# Patient Record
Sex: Male | Born: 1970 | Race: White | Hispanic: No | Marital: Single | Smoking: Current every day smoker
Health system: Southern US, Community
[De-identification: ages and names within clinical notes are randomized; demographics above are authoritative.]

---

## 1998-06-07 ENCOUNTER — Emergency Department (HOSPITAL_COMMUNITY): Admission: EM | Admit: 1998-06-07 | Discharge: 1998-06-07 | Payer: Self-pay | Admitting: Emergency Medicine

## 2003-08-06 ENCOUNTER — Emergency Department (HOSPITAL_COMMUNITY): Admission: EM | Admit: 2003-08-06 | Discharge: 2003-08-06 | Payer: Self-pay | Admitting: Emergency Medicine

## 2006-11-14 ENCOUNTER — Emergency Department (HOSPITAL_COMMUNITY): Admission: EM | Admit: 2006-11-14 | Discharge: 2006-11-14 | Payer: Self-pay | Admitting: Emergency Medicine

## 2007-04-15 ENCOUNTER — Emergency Department (HOSPITAL_COMMUNITY): Admission: EM | Admit: 2007-04-15 | Discharge: 2007-04-15 | Payer: Self-pay | Admitting: Family Medicine

## 2007-08-06 ENCOUNTER — Inpatient Hospital Stay (HOSPITAL_COMMUNITY): Admission: EM | Admit: 2007-08-06 | Discharge: 2007-08-10 | Payer: Self-pay | Admitting: Emergency Medicine

## 2007-08-07 ENCOUNTER — Ambulatory Visit: Payer: Self-pay | Admitting: Infectious Diseases
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
## 2010-10-02 NOTE — Consult Note (Signed)
NAME:  Roger Vargas, Roger Vargas NO.:  [id number]   MEDICAL RECORD NO.:  [id number]          PATIENT TYPE:  INP   LOCATION:  1526                         FACILITY:  [facility]   PHYSICIAN:  Sharlet Salina T. Hoxworth, M.D.DATE OF BIRTH:  1971/05/15   DATE OF CONSULTATION:  08/06/2007  DATE OF DISCHARGE:                                 CONSULTATION   CHIEF COMPLAINT:  Pain, swelling, drainage perineum.   HISTORY OF PRESENT ILLNESS:  I was asked by Dr. Brien Few to evaluate Mr.  Roger Vargas.  He is a generally healthy 40 year old white male who  about 2-3 days ago noticed a small uncomfortable black spot on his  perineum between the scrotum and anus.  This was mildly uncomfortable  and he initially thought it was an ingrown hair.  Over the next day or  so however, he developed increasing discomfort, redness and swelling.  He tried to drain the area himself and removed a small amount of tissue  but he developed increasing redness, swelling.  He has had some fever.  He has had some purulent drainage from the area today.  He presented to  the [facility] emergency room for evaluation.  He denies any previous  history of similar illness.  No diabetes or chronic illness.   PAST MEDICAL HISTORY:  He had hip surgery as a child.  No other  significant surgery, medical illness or hospitalization.   MEDICATIONS:  No regular medicines.   ALLERGIES:  None.   SOCIAL HISTORY:  He is single, works in delivery, smokes cigarettes,  drinks occasional alcohol.   FAMILY HISTORY:  Noncontributory.   REVIEW OF SYSTEMS:  GENERAL:  Positive for some fever and malaise.  RESPIRATORY:  No shortness of breath, cough, wheezing.  CARDIAC:  No  chest pain, palpitations or history of heart disease.  ABDOMEN:  No  abdominal pain, nausea, vomiting, diarrhea.   PHYSICAL EXAM:  Temperature is 99, heart rate is 101, respirations 20,  blood pressure 177/77.  GENERAL:  Alert, well-developed white male in no acute  distress.  SKIN:  See GU/perineum. Warm and dry.  HEENT:  No palpable mass or thyromegaly.  Sclerae nonicteric.  LUNGS:  Clear without wheezing or increased work of breathing.  CARDIAC:  Regular tachycardia.  No murmur.  No edema.  ABDOMEN:  Soft and nontender.  GU/PERINEUM:  Overlying the perineal body is a 3-4 mm skin opening with  purulent drainage and some apparent slight necrotic material with  pressure appearance. Some underlying fluctuance.  There is marked  surrounding erythema and edema over the perineum down to the proximal  thigh crease up over the scrotum and into the lower inguinal areas.  NEUROLOGIC:  Alert. Motor and sensory exam is grossly normal.   LABORATORY AND X-RAY DATA:  White count elevated at 23.7, hemoglobin 15.  Electrolytes abnormal for a sodium of 130, BUN and creatinine are  normal. Urinalysis unremarkable. CT scan of the abdomen and pelvis is  reviewed.  This shows significant inflammatory change over the perineum,  scrotum and inguinal areas. No identifiable abscesses drained.  Concern  was  raised over early Fournier's gangrene.   ASSESSMENT/PLAN:  Severe soft tissue infection of the perineum.  On  examination there is poorly drained purulent or necrotic material.  This  will require I&D and possible debridement. The patient is currently on  broad-spectrum antibiotics.  Unfortunately, he was just fed a full meal  prior to my seeing the patient and surgery may need to be delayed for  this reason and will discuss with anesthesia.      Lorne Skeens. Hoxworth, M.D.  Electronically Signed     BTH/MEDQ  D:  08/06/2007  T:  08/07/2007  Job:  [id number]

## 2010-10-02 NOTE — Op Note (Signed)
NAME:  [name], [name] NO.:  [id number]   MEDICAL RECORD NO.:  [id number]          PATIENT TYPE:  INP   LOCATION:  1526                         [facility]AC[facility]   PHYSICIAN:  Sharlet Salina T. Hoxworth, M.D.DATE OF BIRTH:  26-Oct-1970   DATE OF PROCEDURE:  08/06/2007  DATE OF DISCHARGE:                               OPERATIVE REPORT   PRE AND POSTOPERATIVE DIAGNOSIS:  Perineal abscess.   SURGICAL PROCEDURES:  Incision and drainage of perineal abscess.   SURGEON:  Lorne Skeens. Hoxworth, M.D.   ANESTHESIA:  General.   BRIEF HISTORY:  Roger Vargas is a 40 year old male who presents with a  48 to 72-hour history of initial small painful skin blister on his  perineum which has progressed to fever, redness and swelling involving  his perineum, scrotum and up into the inguinal areas.  He has  subsequently developed purulent drainage.  Examination reveals an area  of purulent drainage over the perineal body with some purulent fluid and  necrotic-appearing debris.  White count was 23,000 and CT scan shows  extensive inflammatory change in the soft tissue over the perineum and  scrotum, a question of early Fournier's gangrene.  He appears to have at  least an incompletely drained abscess and I have recommended incision  and drainage, possible debridement in the operating room.  He is already  on broad spectrum antibiotics.   DESCRIPTION OF OPERATION:  The patient was brought to the operating room  and placed in supine position on the operating table and general  endotracheal anesthesia was induced.  He was carefully positioned in  lithotomy position and the perineum sterilely prepped and draped.  Correct patient and procedure were verified.  Again there was a several  millimeter pinpoint opening over the perineal body with purulent  drainage.  I excised a 2-3 cm ellipse of skin around this and dissection  was carried down into a discrete abscess cavity was contained a small  amount of necrotic probably subcutaneous tissue and purulent fluid.  This was cultured.  There was a small amount necrotic tissue at the base  and underlying muscle was exposed but there did not appear to be any  tracking toward any of the areas of inflammation up toward the scrotum  or thighs and only a small amount necrotic tissue locally with the  abscess.  All necrotic tissue was debrided and this left a cavity about  3 x 1.5 cm x 1 cm.  This area was infiltrated with Marcaine.  Saline 4x4  gauze packing was placed and then dry sterile dressing.  The patient was  taken recovery in good condition.      Lorne Skeens. Hoxworth, M.D.  Electronically Signed     BTH/MEDQ  D:  08/06/2007  T:  08/07/2007  Job:  [id number]

## 2011-02-11 LAB — BASIC METABOLIC PANEL
BUN: 10
BUN: 9
CO2: 29
Calcium: 9.4
Chloride: 100
Chloride: 92 — ABNORMAL LOW
Creatinine, Ser: 0.82
GFR calc Af Amer: >60
GFR calc non Af Amer: >60
GFR calc non Af Amer: >60
GFR calc non Af Amer: >60
GFR calc non Af Amer: >60
Glucose, Bld: 125 — ABNORMAL HIGH
Glucose, Bld: 136 — ABNORMAL HIGH
Glucose, Bld: 98
Potassium: 3.6
Potassium: 3.8
Potassium: 3.9
Sodium: 133 — ABNORMAL LOW
Sodium: 142

## 2011-02-11 LAB — CBC
HCT: 37.6 — ABNORMAL LOW
HCT: 38.3 — ABNORMAL LOW
HCT: 42.3
Hemoglobin: 13.2
Hemoglobin: 14.8
MCHC: 34.1
MCHC: 34.4
MCV: 85.9
MCV: 88.3
Platelets: 225
Platelets: 243
Platelets: 262
RBC: 4.31
RBC: 4.91
RDW: 13.8
RDW: 13.9
RDW: 14.1
RDW: 14.2

## 2011-02-11 LAB — CULTURE, BLOOD (ROUTINE X 2)

## 2011-02-11 LAB — VANCOMYCIN, TROUGH: Vancomycin Tr: 6.3

## 2011-02-11 LAB — DIFFERENTIAL
Basophils Absolute: 0
Eosinophils Absolute: 0
Lymphs Abs: 0.9
Monocytes Absolute: 1.7 — ABNORMAL HIGH

## 2011-02-11 LAB — URINALYSIS, ROUTINE W REFLEX MICROSCOPIC
Bilirubin Urine: NEGATIVE
Hgb urine dipstick: NEGATIVE
Protein, ur: NEGATIVE
Urobilinogen, UA: 0.2

## 2011-02-11 LAB — WOUND CULTURE

## 2017-01-12 ENCOUNTER — Emergency Department (HOSPITAL_BASED_OUTPATIENT_CLINIC_OR_DEPARTMENT_OTHER)
Admission: EM | Admit: 2017-01-12 | Discharge: 2017-01-13 | Disposition: A | Discharge status: Home / Self Care | Payer: Self-pay | Attending: Emergency Medicine | Admitting: Emergency Medicine

## 2017-01-12 ENCOUNTER — Encounter (HOSPITAL_BASED_OUTPATIENT_CLINIC_OR_DEPARTMENT_OTHER): Payer: Self-pay | Admitting: Emergency Medicine

## 2017-01-12 DIAGNOSIS — K0889 Other specified disorders of teeth and supporting structures: Secondary | ICD-10-CM | POA: Insufficient documentation

## 2017-01-12 DIAGNOSIS — F17200 Nicotine dependence, unspecified, uncomplicated: Secondary | ICD-10-CM | POA: Insufficient documentation

## 2017-01-12 NOTE — ED Triage Notes (Signed)
Patient states that he is having pain to the left side of his mouth x 3 days. The patient reports that he may have bit down on something wrong. Denies any N/V or trouble swallowing

## 2017-01-13 MED ORDER — NAPROXEN 500 MG PO TABS: ORAL_TABLET | ORAL | 0 refills | Status: AC

## 2017-01-13 MED ORDER — BUPIVACAINE-EPINEPHRINE (PF) 0.5% -1:200000 IJ SOLN
1.8000 mL | Freq: Once | INTRAMUSCULAR | Status: AC
  Administered 2017-01-13: 1.8 mL
  Filled 2017-01-13: qty 1.8

## 2017-01-13 MED ORDER — PENICILLIN V POTASSIUM 250 MG PO TABS
500.0000 mg | ORAL_TABLET | Freq: Once | ORAL | Status: AC
  Administered 2017-01-13: 500 mg via ORAL
  Filled 2017-01-13: qty 2

## 2017-01-13 MED ORDER — PENICILLIN V POTASSIUM 500 MG PO TABS: 500.0000 mg | ORAL_TABLET | Freq: Four times a day (QID) | ORAL | 0 refills | Status: AC

## 2017-01-13 MED ORDER — HYDROCODONE-ACETAMINOPHEN 5-325 MG PO TABS: 1.0000 | ORAL_TABLET | Freq: Four times a day (QID) | ORAL | 0 refills | Status: AC | PRN

## 2017-01-13 NOTE — ED Notes (Signed)
Took Aleve earlier today with little help.

## 2017-01-13 NOTE — ED Provider Notes (Signed)
[facility] Provider Note: [name], MD, FACEP  CSN: [id number] MRN: [id number] ARRIVAL: 01/12/17 at 2034 ROOM: MH09/MH09   CHIEF COMPLAINT  Dental Pain   HISTORY OF PRESENT ILLNESS  01/13/17 1:18 AM Roger Vargas is a 46 y.o. male with a 2 to three-day history of pain associated with his left lower first molar. The pain has become severe and is worse with eating or drinking. There is associated swelling of the adjacent soft tissue. There is no associated fever or chills. There is no associated lymphadenopathy. He has not gotten relief with over-the-counter medications.  Consultation with the [facility] state controlled substances database reveals the patient has received no opioid prescriptions in the past year.   History reviewed. No pertinent past medical history.  History reviewed. No pertinent surgical history.  History reviewed. No pertinent family history.  Social History  Substance Use Topics  . Smoking status: Current Every Day Smoker  . Smokeless tobacco: Never Used  . Alcohol use Yes    Prior to Admission medications   Medication Sig Start Date End Date Taking? Authorizing Provider  HYDROcodone-acetaminophen (NORCO) 5-325 MG tablet Take 1 tablet by mouth every 6 (six) hours as needed (for severe pain). 01/13/17   Carolos Fecher, MD  naproxen (NAPROSYN) 500 MG tablet Take one tablet twice daily as needed for dental pain. 01/13/17   Marriana Hibberd, MD  penicillin v potassium (VEETID) 500 MG tablet Take 1 tablet (500 mg total) by mouth 4 (four) times daily. 01/13/17   Abed Schar, Jonny Ruiz, MD    Allergies Patient has no known allergies.   REVIEW OF SYSTEMS  Negative except as noted here or in the History of Present Illness.   PHYSICAL EXAMINATION  Initial Vital Signs Blood pressure (!) 149/91, pulse 82, temperature 98.3 F (36.8 C), temperature source Oral, resp. rate (!) 22, height 6\' 1"  (1.854 m), weight 88.5 kg (195 lb), SpO2 98  %.  Examination General: Well-developed, well-nourished male in no acute distress; appearance consistent with age of record HENT: normocephalic; atraumatic; left lower first molar with amalgam restorations, tender to percussion with adjacent soft tissue swelling Eyes: pupils equal, round and reactive to light; extraocular muscles intact Neck: supple; no lymphadenopathy Heart: regular rate and rhythm Lungs: clear to auscultation bilaterally Abdomen: soft; nondistended; nontender; bowel sounds present Extremities: No deformity; full range of motion Neurologic: Awake, alert and oriented; motor function intact in all extremities and symmetric; no facial droop Skin: Warm and dry Psychiatric: Flat affect   RESULTS  Summary of this visit's results, reviewed by myself:   EKG Interpretation  Date/Time:    Ventricular Rate:    PR Interval:    QRS Duration:   QT Interval:    QTC Calculation:   R Axis:     Text Interpretation:        Laboratory Studies: No results found for this or any previous visit (from the past 24 hour(s)). Imaging Studies: No results found.  ED COURSE  Nursing notes and initial vitals signs, including pulse oximetry, reviewed.  Vitals:   01/12/17 2041 01/12/17 2042 01/13/17 0050  BP:  (!) 146/91 (!) 149/91  Pulse:  94 82  Resp:  20 (!) 22  Temp:  98.3 F (36.8 C)   TempSrc:  Oral   SpO2:  100% 98%  Weight: 88.5 kg (195 lb)    Height: 6\' 1"  (1.854 m)      PROCEDURES   DENTAL BLOCK 1.8 milliliters of 0.5% bupivacaine with epinephrine  were injected into the buccal fold adjacent to the left lower first molar. The patient tolerated this well and there were no immediate complications. Adequate analgesia was obtained.   ED DIAGNOSES     ICD-10-CM   1. Pain, dental K08.89        Paula Libra, MD 01/13/17 [phone]

## 2017-01-18 ENCOUNTER — Emergency Department (HOSPITAL_COMMUNITY)
Admission: EM | Admit: 2017-01-18 | Discharge: 2017-01-19 | Disposition: A | Discharge status: Home / Self Care | Payer: Self-pay | Attending: Emergency Medicine | Admitting: Emergency Medicine

## 2017-01-18 ENCOUNTER — Encounter (HOSPITAL_COMMUNITY): Payer: Self-pay | Admitting: Emergency Medicine

## 2017-01-18 ENCOUNTER — Emergency Department (HOSPITAL_COMMUNITY): Payer: Self-pay

## 2017-01-18 DIAGNOSIS — T68XXXA Hypothermia, initial encounter: Secondary | ICD-10-CM | POA: Insufficient documentation

## 2017-01-18 DIAGNOSIS — F17200 Nicotine dependence, unspecified, uncomplicated: Secondary | ICD-10-CM | POA: Insufficient documentation

## 2017-01-18 DIAGNOSIS — F1910 Other psychoactive substance abuse, uncomplicated: Secondary | ICD-10-CM | POA: Insufficient documentation

## 2017-01-18 DIAGNOSIS — Z79899 Other long term (current) drug therapy: Secondary | ICD-10-CM | POA: Insufficient documentation

## 2017-01-18 DIAGNOSIS — W938XXA Exposure to other excessive cold of man-made origin, initial encounter: Secondary | ICD-10-CM | POA: Insufficient documentation

## 2017-01-18 DIAGNOSIS — I959 Hypotension, unspecified: Secondary | ICD-10-CM | POA: Insufficient documentation

## 2017-01-18 LAB — COMPREHENSIVE METABOLIC PANEL
ALBUMIN: 3.1 g/dL — AB (ref 3.5–5.0)
ALK PHOS: 54 U/L (ref 38–126)
ALT: 28 U/L (ref 17–63)
ANION GAP: 7 (ref 5–15)
AST: 26 U/L (ref 15–41)
BILIRUBIN TOTAL: 0.3 mg/dL (ref 0.3–1.2)
BUN: 20 mg/dL (ref 6–20)
CALCIUM: 8.3 mg/dL — AB (ref 8.9–10.3)
CO2: 25 mmol/L (ref 22–32)
Chloride: 105 mmol/L (ref 101–111)
Creatinine, Ser: 1.08 mg/dL (ref 0.61–1.24)
GFR calc Af Amer: >60 mL/min (ref >60)
GLUCOSE: 154 mg/dL — AB (ref 65–99)
Potassium: 3.2 mmol/L — ABNORMAL LOW (ref 3.5–5.1)
Sodium: 137 mmol/L (ref 135–145)
TOTAL PROTEIN: 5.9 g/dL — AB (ref 6.5–8.1)

## 2017-01-18 LAB — RAPID URINE DRUG SCREEN, HOSP PERFORMED
Amphetamines: NOT DETECTED
BARBITURATES: NOT DETECTED
Benzodiazepines: POSITIVE — AB
COCAINE: POSITIVE — AB
Opiates: POSITIVE — AB
Tetrahydrocannabinol: NOT DETECTED

## 2017-01-18 LAB — CBC
HEMATOCRIT: 36.7 % — AB (ref 39.0–52.0)
Hemoglobin: 12.1 g/dL — ABNORMAL LOW (ref 13.0–17.0)
MCH: 28 pg (ref 26.0–34.0)
MCHC: 33 g/dL (ref 30.0–36.0)
MCV: 85 fL (ref 78.0–100.0)
PLATELETS: 247 10*3/uL (ref 150–400)
RBC: 4.32 MIL/uL (ref 4.22–5.81)
RDW: 13.6 % (ref 11.5–15.5)
WBC: 11.1 10*3/uL — ABNORMAL HIGH (ref 4.0–10.5)

## 2017-01-18 LAB — ETHANOL

## 2017-01-18 LAB — SALICYLATE LEVEL: Salicylate Lvl: <7 mg/dL (ref 2.8–30.0)

## 2017-01-18 LAB — CBG MONITORING, ED: GLUCOSE-CAPILLARY: 157 mg/dL — AB (ref 65–99)

## 2017-01-18 LAB — ACETAMINOPHEN LEVEL

## 2017-01-18 MED ORDER — SODIUM CHLORIDE 0.9 % IV BOLUS (SEPSIS)
1000.0000 mL | Freq: Once | INTRAVENOUS | Status: AC
  Administered 2017-01-18: 1000 mL via INTRAVENOUS

## 2017-01-18 NOTE — ED Notes (Signed)
Bed: RESA Expected date:  Expected time:  Means of arrival:  Comments: OD 

## 2017-01-18 NOTE — ED Triage Notes (Signed)
Pt presents by EMS for overdose of possible cocaine/heroine. PD on scene gave Narcan nasally. EMS advised pt was combative on scene and was given 5mg  Haldol IVP and 5mg  Versed IVP. SPO2 on room air prior to arrival 90%. EMS placed on 15L O2 and possible seizure activity while on scene.

## 2017-01-18 NOTE — ED Provider Notes (Signed)
[facility]-EMERGENCY DEPT Provider Note   CSN: [id number] Arrival date & time: 01/18/17  2121     History   Chief Complaint Chief Complaint  Patient presents with  . Drug Overdose    HPI Roger Vargas is a 46 y.o. male who presents by EMS for possible overdose. He was given 2 mg Narcan intranasally by PD. EMS reports that once they arrived patient was extremely combative, with with rigid muscle tone and noted to have bit his tongue.  He was given 5mg  haldol and 5mg  versed IVP.  He is unable to answer commands.  According to PD he was surrounded by crack and heroin paraphernalia when he was found.   HPI  History reviewed. No pertinent past medical history.  There are no active problems to display for this patient.   History reviewed. No pertinent surgical history.     Home Medications    Prior to Admission medications   Medication Sig Start Date End Date Taking? Authorizing Provider  HYDROcodone-acetaminophen (NORCO) 5-325 MG tablet Take 1 tablet by mouth every 6 (six) hours as needed (for severe pain). 01/13/17  Yes Molpus, John, MD  naproxen (NAPROSYN) 500 MG tablet Take one tablet twice daily as needed for dental pain. 01/13/17  Yes Molpus, John, MD  penicillin v potassium (VEETID) 500 MG tablet Take 1 tablet (500 mg total) by mouth 4 (four) times daily. 01/13/17  Yes Molpus, Jonny Ruiz, MD    Family History History reviewed. No pertinent family history.  Social History Social History  Substance Use Topics  . Smoking status: Current Every Day Smoker  . Smokeless tobacco: Never Used  . Alcohol use Yes     Allergies   Patient has no known allergies.   Review of Systems Review of Systems  Unable to perform ROS: Mental status change     Physical Exam Updated Vital Signs BP 116/82   Pulse (!) 117   Temp 98.5 F (36.9 C) (Oral)   Resp (!) 21   Ht 6\' 1"  (1.854 m)   Wt 89.8 kg (198 lb)   SpO2 100%   BMI 26.12 kg/m   Physical Exam  Constitutional: He appears  well-developed and well-nourished.  HENT:  Head: Normocephalic and atraumatic.  Right Ear: No hemotympanum.  Left Ear: No hemotympanum.  Mouth/Throat: Uvula is midline and oropharynx is clear and moist. Oral lesions (Tongue laceration. ) present. Normal dentition.  Blood in mouth with tongue laceration that has stopped bleeding.    Eyes: Conjunctivae are normal.  Neck: Normal range of motion. Neck supple.  Cardiovascular: Normal rate, regular rhythm, normal heart sounds and intact distal pulses.  Exam reveals no friction rub.   No murmur heard. Pulmonary/Chest: Effort normal and breath sounds normal. No respiratory distress. He has no wheezes. He has no rales. He exhibits no tenderness.  Abdominal: Soft. Bowel sounds are normal. There is no tenderness.  Musculoskeletal: He exhibits no edema.  Bilateral upper and lower extremities palpated, no obvious deformities, crepitus, or edema. All compartments are soft, easily compressible. All extremities are warm and well perfused, he is spontaneously moving all extremities.  Skin: Skin is warm and dry.  Psychiatric:  Unable to assess secondary to mental status change.   Nursing note and vitals reviewed.    ED Treatments / Results  Labs (all labs ordered are listed, but only abnormal results are displayed) Labs Reviewed  COMPREHENSIVE METABOLIC PANEL - Abnormal; Notable for the following:       Result Value  Potassium 3.2 (*)    Glucose, Bld 154 (*)    Calcium 8.3 (*)    Total Protein 5.9 (*)    Albumin 3.1 (*)    All other components within normal limits  ACETAMINOPHEN LEVEL - Abnormal; Notable for the following:    Acetaminophen (Tylenol), Serum <10 (*)    All other components within normal limits  CBC - Abnormal; Notable for the following:    WBC 11.1 (*)    Hemoglobin 12.1 (*)    HCT 36.7 (*)    All other components within normal limits  RAPID URINE DRUG SCREEN, HOSP PERFORMED - Abnormal; Notable for the following:    Opiates  POSITIVE (*)    Cocaine POSITIVE (*)    Benzodiazepines POSITIVE (*)    All other components within normal limits  CBG MONITORING, ED - Abnormal; Notable for the following:    Glucose-Capillary 157 (*)    All other components within normal limits  ETHANOL  SALICYLATE LEVEL    EKG  EKG Interpretation  Date/Time:  Saturday January 18 2017 21:34:39 EDT Ventricular Rate:  109 PR Interval:    QRS Duration: 89 QT Interval:  338 QTC Calculation: 456 R Axis:   85 Text Interpretation:  Sinus tachycardia Multiple ventricular premature complexes Aberrant complex Consider left ventricular hypertrophy Nonspecific T abnormalities, inferior leads No old tracing to compare Confirmed by Pricilla LovelessGoldston, Scott [phone]) on 01/18/2017 10:46:04 PM       Radiology No results found.  Procedures Procedures (including critical care time) CRITICAL CARE Performed by: Lyndel SafeElizabeth Leonarda Leis Total critical care time: 31 minutes Critical care time was exclusive of separately billable procedures and treating other patients. Critical care was necessary to treat or prevent imminent or life-threatening deterioration. Critical care was time spent personally by me on the following activities: development of treatment plan with patient and/or surrogate as well as nursing, discussions with consultants, evaluation of patient's response to treatment, examination of patient, obtaining history from patient or surrogate, ordering and performing treatments and interventions, ordering and review of laboratory studies, ordering and review of radiographic studies, pulse oximetry and re-evaluation of patient's condition. Overdose, altered mental status, unconscious, questionable seizure.    Medications Ordered in ED Medications  sodium chloride 0.9 % bolus 1,000 mL (0 mLs Intravenous Stopped 01/18/17 2240)     Initial Impression / Assessment and Plan / ED Course  I have reviewed the triage vital signs and the nursing  notes.  Pertinent labs & imaging results that were available during my care of the patient were reviewed by me and considered in my medical decision making (see chart for details).  Clinical Course as of Jan 19 2352  Sat Jan 18, 2017  2343 Patient was re-evaluated.  Respirations are even, unlabored.  GCS eye 4, verbal 4, motor 6 and then falls back asleep.   [EH]    Clinical Course User Index [EH] Cristina GongHammond, Maridee Slape W, PA-C    Based on possible seizure like activity ordered head CT.    At shift change [facility] PA-C who will follow pending studies, re-evaulate and determine disposition.      Final Clinical Impressions(s) / ED Diagnoses   Final diagnoses:  None    New Prescriptions New Prescriptions   No medications on file     Norman ClayHammond, Ariyona Eid W, PA-C 01/18/17 2353    Pricilla LovelessGoldston, Scott, MD 01/28/17 [phone]

## 2017-01-18 NOTE — ED Provider Notes (Signed)
PROGRESS NOTE                                                                                                                 This is a sign-out from  PA PluckeminHammond at shift change: Roger Vargas is a 46 y.o. male presenting with possible heroin overdose. EMS was called for unresponsive patient. 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 Patient had blood in his mouth, question possible seizure. He became agitated and combative and needed multiple doses of benzodiazepines to calm him safely in the ED. Plan is to follow-up head CT, evaluate patient after he becomes more sober and monitor in the ED. EKG with no significantly acute findings. Mild hypo-kalemia at 3.2.  Please refer to previous note for full HPI, ROS, PMH and PE.   Admitting intensivist accidentally ordered a troponin on a patient who was not admitted to him. The troponin has now resulted as positive at 0.04. Patient seen and evaluated the bedside, he is resting comfortably alert at this time. He states that he can't remember what happened last night he says that he intermittently does inject heroin he denies any cocaine or methamphetamine use. He denies any chest pain at this time. Plan is to obtain delta troponin in 2 hours and observe patient in the ED. Will also check a magnesium as his potassium is mildly depleted.  Plan to repeat troponin, patient remains chest pain-free in the ED, will check a magnesium and replete magnesium and potassium. We'll by mouth challenge and ambulate. Case signed out to PA PorumKhatri at shift change.    Kaylyn Limisciotta, Armentha Branagan, PA-C 01/19/17 Ulis [id number]    Mancel BaleWentz, Elliott, MD 01/19/17 0730

## 2017-01-19 LAB — MAGNESIUM: Magnesium: 2.2 mg/dL (ref 1.7–2.4)

## 2017-01-19 LAB — TROPONIN I
TROPONIN I: 0.04 ng/mL — AB (ref <0.03)
Troponin I: 0.04 ng/mL (ref <0.03)

## 2017-01-19 MED ORDER — POTASSIUM CHLORIDE CRYS ER 20 MEQ PO TBCR
40.0000 meq | EXTENDED_RELEASE_TABLET | Freq: Once | ORAL | Status: AC
  Administered 2017-01-19: 40 meq via ORAL
  Filled 2017-01-19: qty 2

## 2017-01-19 MED ORDER — MAGNESIUM SULFATE 2 GM/50ML IV SOLN
2.0000 g | Freq: Once | INTRAVENOUS | Status: AC
  Administered 2017-01-19: 2 g via INTRAVENOUS
  Filled 2017-01-19: qty 50

## 2017-01-19 NOTE — ED Notes (Addendum)
Upon d/c, pt refused bus pass. When asked if he could call someone, pt sts, "I'll figure it out." Pt was advised that there is a phone in the lobby and he can have bus pass if needed. Pt was provided with juice but refused a sandwich. Pt was directed to lobby and walks unassisted, with steady gait. Pt is A&O x4. Primary RN made aware of pt being d/c to lobby.

## 2017-01-19 NOTE — ED Provider Notes (Signed)
  Physical Exam  BP (!) 129/110   Pulse 86   Temp 98.5 F (36.9 C) (Oral)   Resp 18   Ht 6\' 1"  (1.854 m)   Wt 89.8 kg (198 lb)   SpO2 100%   BMI 26.12 kg/m   Physical Exam  Constitutional: He appears well-developed and well-nourished. No distress.  Appears drowsy but able to answer questions. Not in acute distress.  HENT:  Head: Normocephalic and atraumatic.  Eyes: Conjunctivae and EOM are normal. No scleral icterus.  Neck: Normal range of motion.  Cardiovascular: Normal rate, regular rhythm and normal heart sounds.   Pulmonary/Chest: Effort normal and breath sounds normal. No respiratory distress.  Neurological: He is alert.  Skin: No rash noted. He is not diaphoretic.  Psychiatric: He has a normal mood and affect.  Nursing note and vitals reviewed.   ED Course  Procedures  MDM Care handed off from previous provider Pisciotta, PA-C. Briefly, patient Presents after drug overdose, possibly heroin. UDS positive for opiates, cocaine and benzodiazepines. Plan was to disco according to delta troponin. Initial troponin was elevated at 0.04. He denies any chest pain, shortness of breath. Magnesium and potassium repleted. Delta troponin ordered at 0734.  0900: Delta troponin returned as stable as 0.04. Per previous provider, patient will be discharged. He is able to ambulate here in the ED. He remains chest pain-free on my recheck and denies any shortness of breath. He states that he is unable to still remember what occurred last night.        Dietrich PatesKhatri, Melenda Bielak, PA-C 01/19/17 [id number]    Tegeler, Canary Brimhristopher J, MD 01/20/17 1409

## 2017-01-19 NOTE — Discharge Instructions (Signed)
Return to ED for chest pain, trouble breathing, trouble swallowing, leg swelling, numbness,head injuries.

## 2018-05-04 IMAGING — CT CT HEAD W/O CM
3 of 4 series · 16 of 47 positions shown, 19 images · non-contrast
Comparison: None.

CLINICAL DATA: Drug overdose possibly cocaine/heroin. Altered level
of consciousness.

EXAM:
CT HEAD WITHOUT CONTRAST
TECHNIQUE: Contiguous axial images were obtained from the base of the skull
through the vertex without intravenous contrast.

[Series 2: head w/o · axial · non-contrast · 0.46mm/px · z∈[-166,-31]mm · 10 of 33 slices shown, 13 images]
[im 3/33  brain]
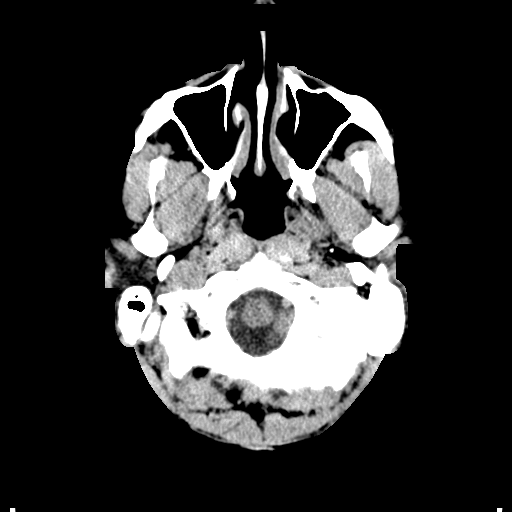
[im 3/33  bone]
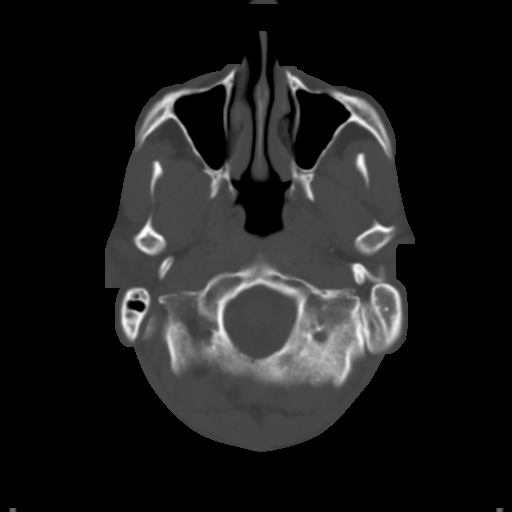
[im 5/33  brain]
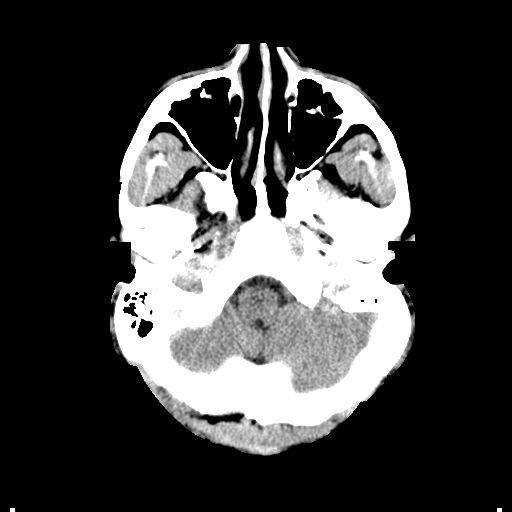
[im 10/33  brain]
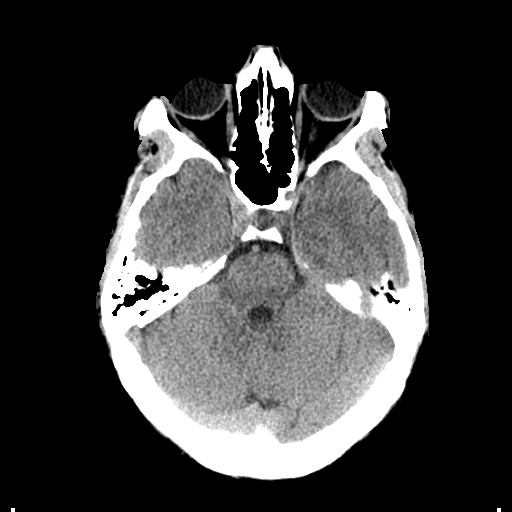
[im 12/33  brain]
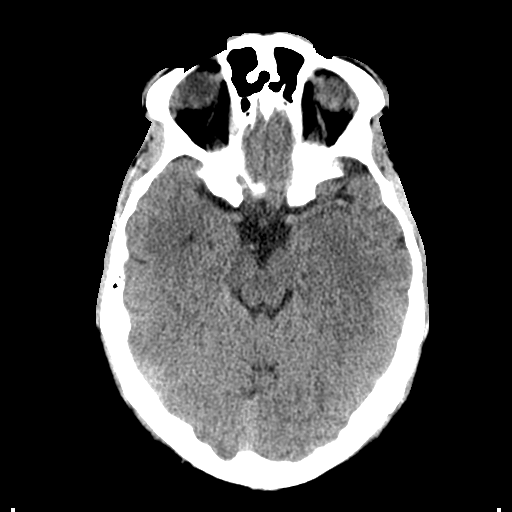
[im 14/33  brain]
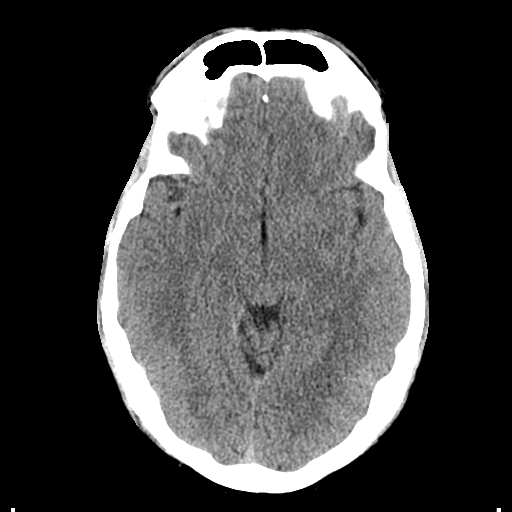
[im 14/33  bone]
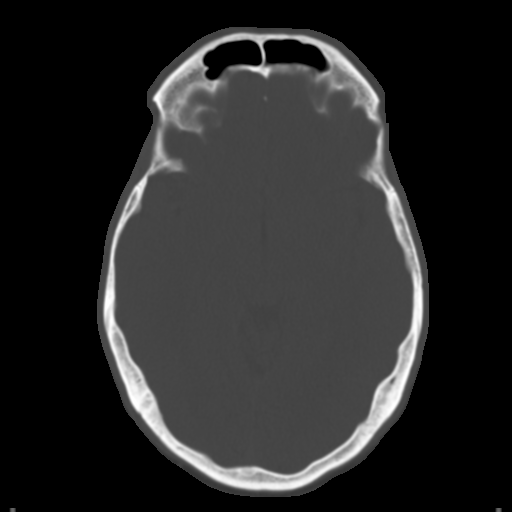
[im 19/33  brain]
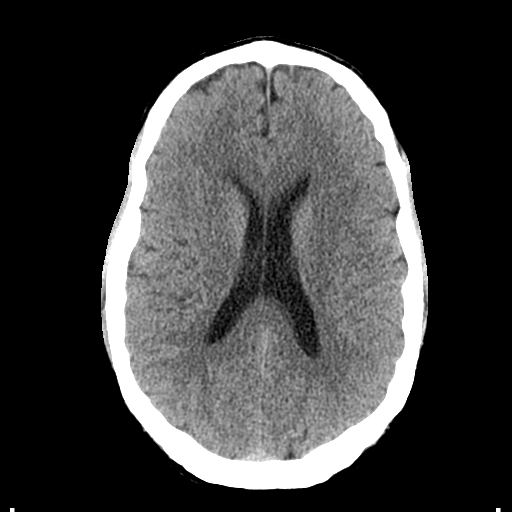
[im 21/33  brain]
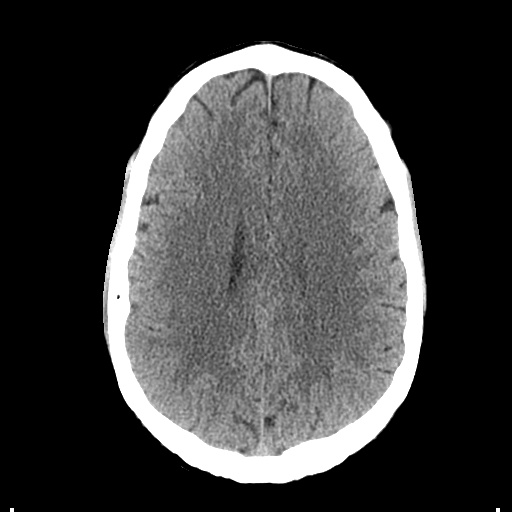
[im 23/33  brain]
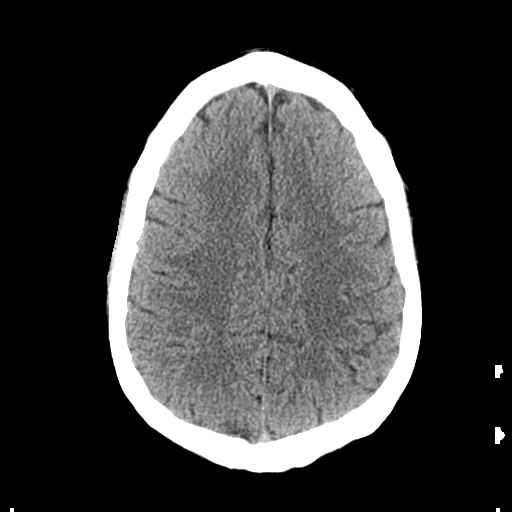
[im 28/33  brain]
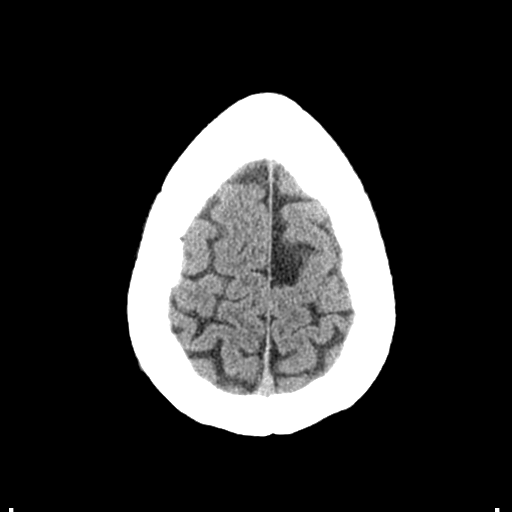
[im 28/33  bone]
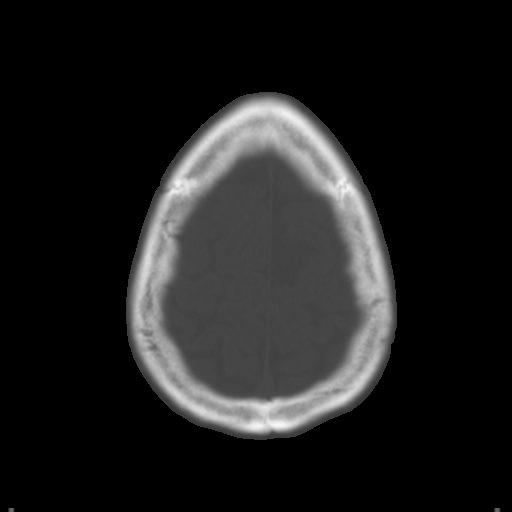
[im 30/33  brain]
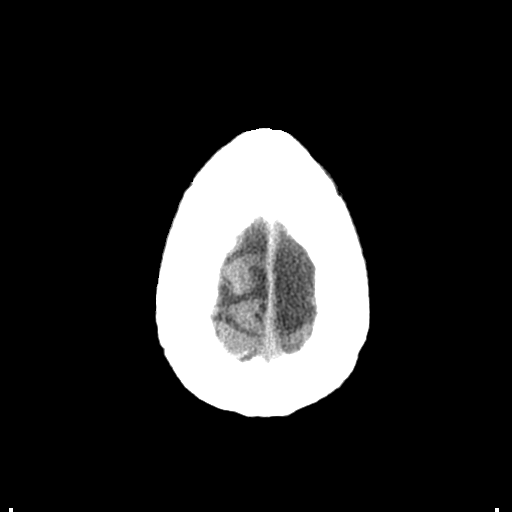

[Series 5: coronal · coronal · 0.34mm/px · 3 of 71 slices shown]
[im 24/71  brain]
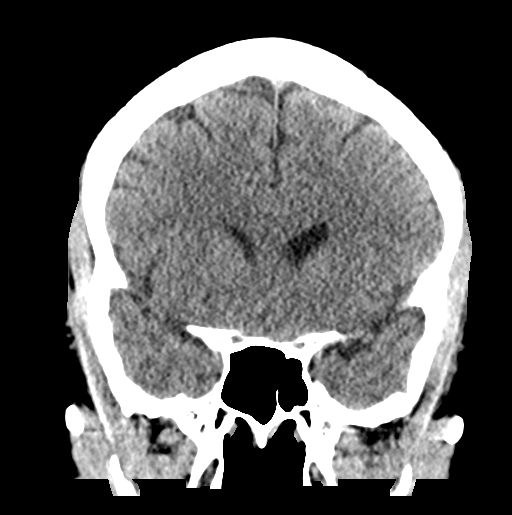
[im 32/71  brain]
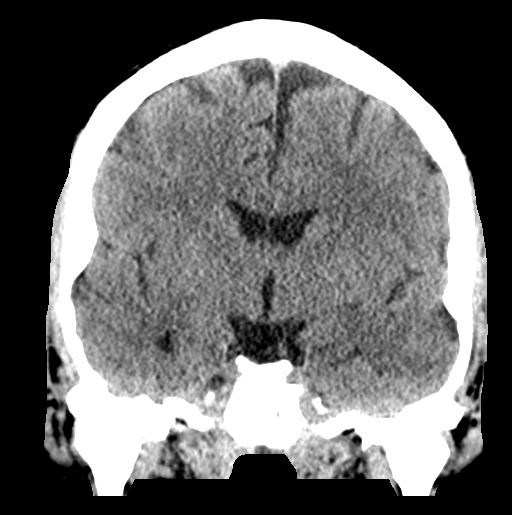
[im 39/71  brain]
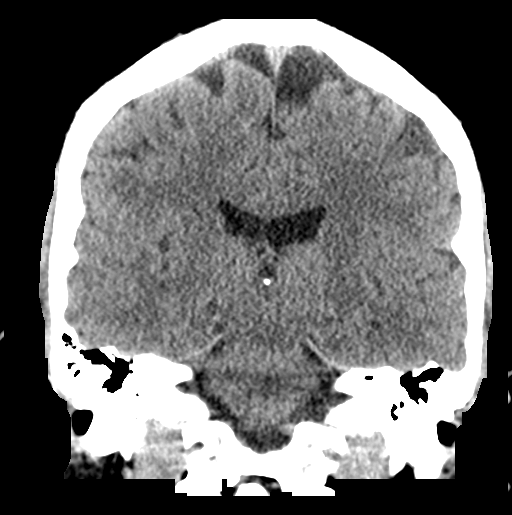

[Series 6: sagittal · sagittal · 0.34mm/px · 3 of 50 slices shown]
[im 17/50  brain]
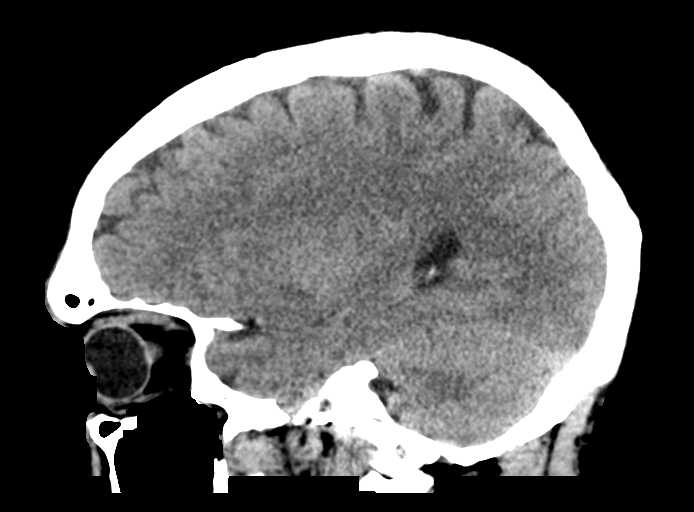
[im 25/50  brain]
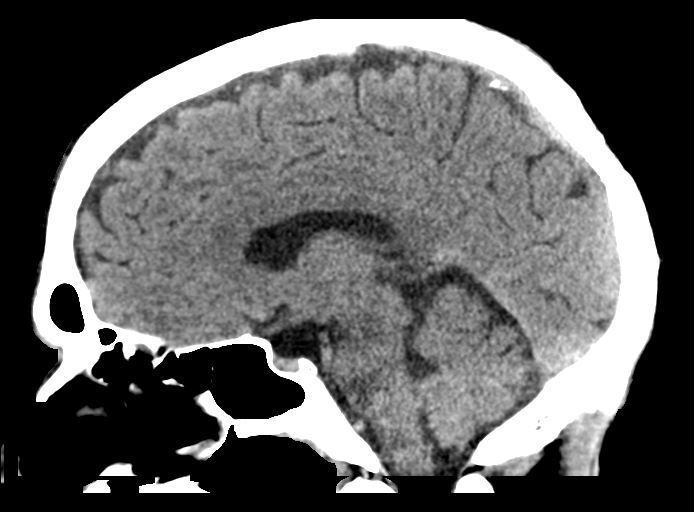
[im 33/50  brain]
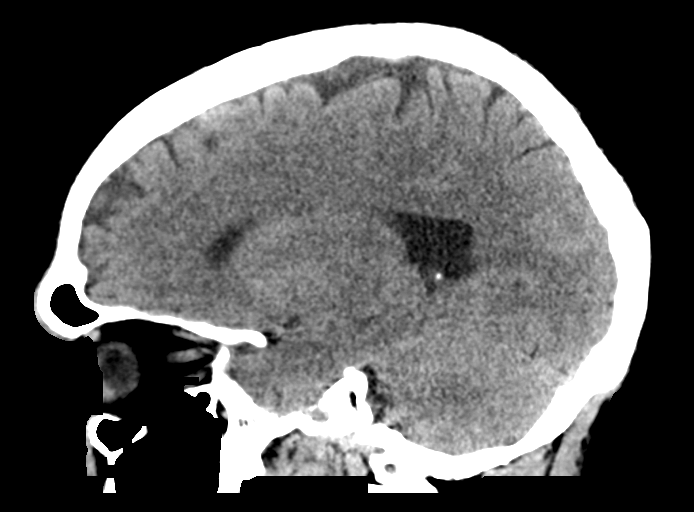

[16 of 47 positions shown; findings below may reference images not displayed]

FINDINGS: Brain: No evidence of acute infarction, hemorrhage, hydrocephalus,
extra-axial collection or mass lesion/mass effect.

Vascular: No hyperdense vessel or unexpected calcification.

Skull: Normal. Negative for fracture or focal lesion.

Sinuses/Orbits: No acute finding.

Other: None.
IMPRESSION: No acute intracranial abnormality.
